# Patient Record
Sex: Female | Born: 1960 | Race: Black or African American | Hispanic: No | Smoking: Former smoker
Health system: Southern US, Community
[De-identification: ages and names within clinical notes are randomized; demographics above are authoritative.]

## PROBLEM LIST (undated history)

## (undated) DIAGNOSIS — K642 Third degree hemorrhoids: Secondary | ICD-10-CM

## (undated) DIAGNOSIS — A630 Anogenital (venereal) warts: Secondary | ICD-10-CM

## (undated) DIAGNOSIS — K5909 Other constipation: Secondary | ICD-10-CM

## (undated) DIAGNOSIS — I10 Essential (primary) hypertension: Secondary | ICD-10-CM

---

## 2006-02-27 ENCOUNTER — Encounter: Admission: RE | Admit: 2006-02-27 | Discharge: 2006-02-27 | Payer: Self-pay | Admitting: Gastroenterology

## 2008-02-07 ENCOUNTER — Emergency Department (HOSPITAL_COMMUNITY): Admission: EM | Admit: 2008-02-07 | Discharge: 2008-02-08 | Payer: Self-pay | Admitting: Emergency Medicine

## 2009-04-11 IMAGING — CR DG CHEST 2V
2 series · 2 of 2 positions shown · non-contrast
Comparison: None

CLINICAL DATA: Chest pain, shortness of breath

CHEST - 2 VIEW

[w chest pa]
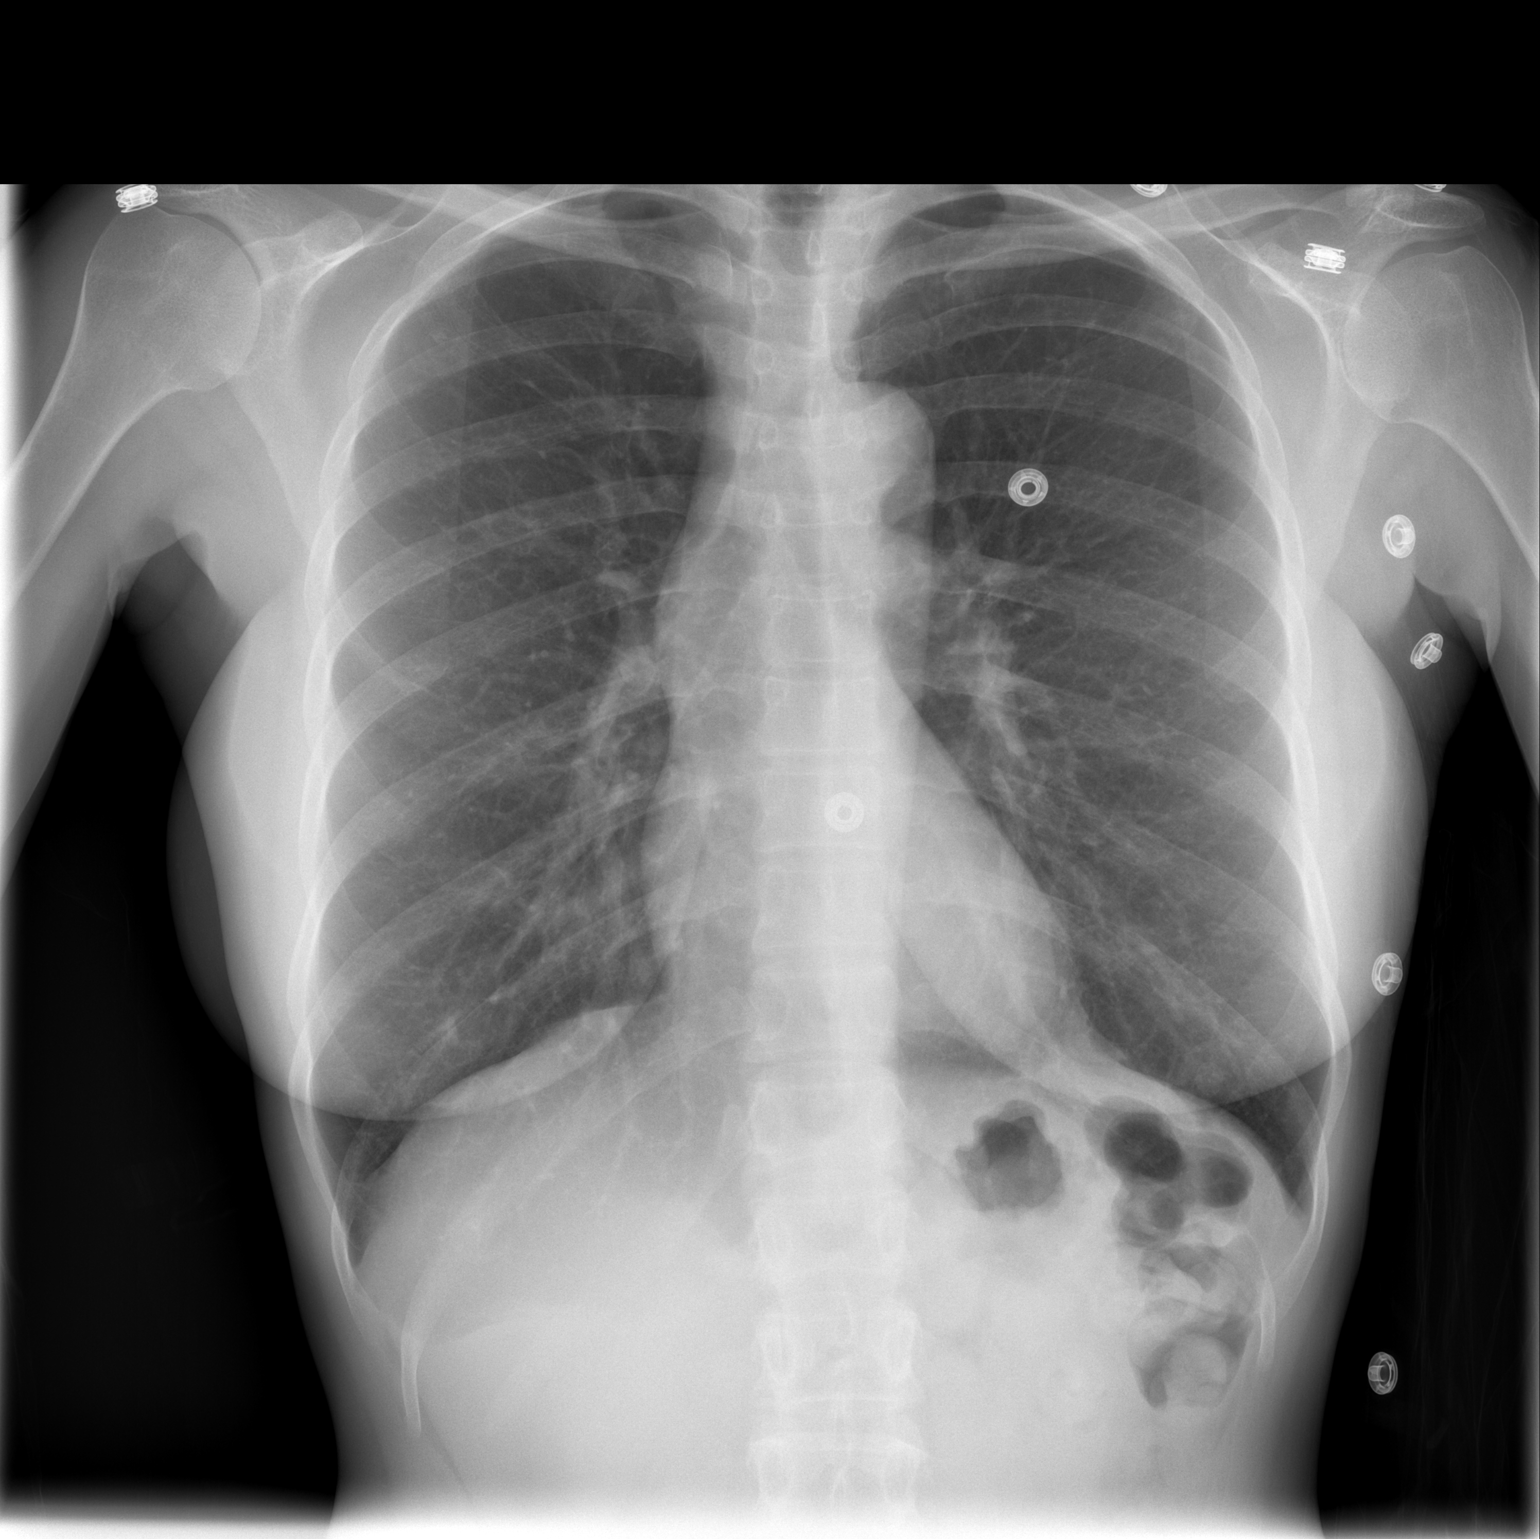

[w chest lat]
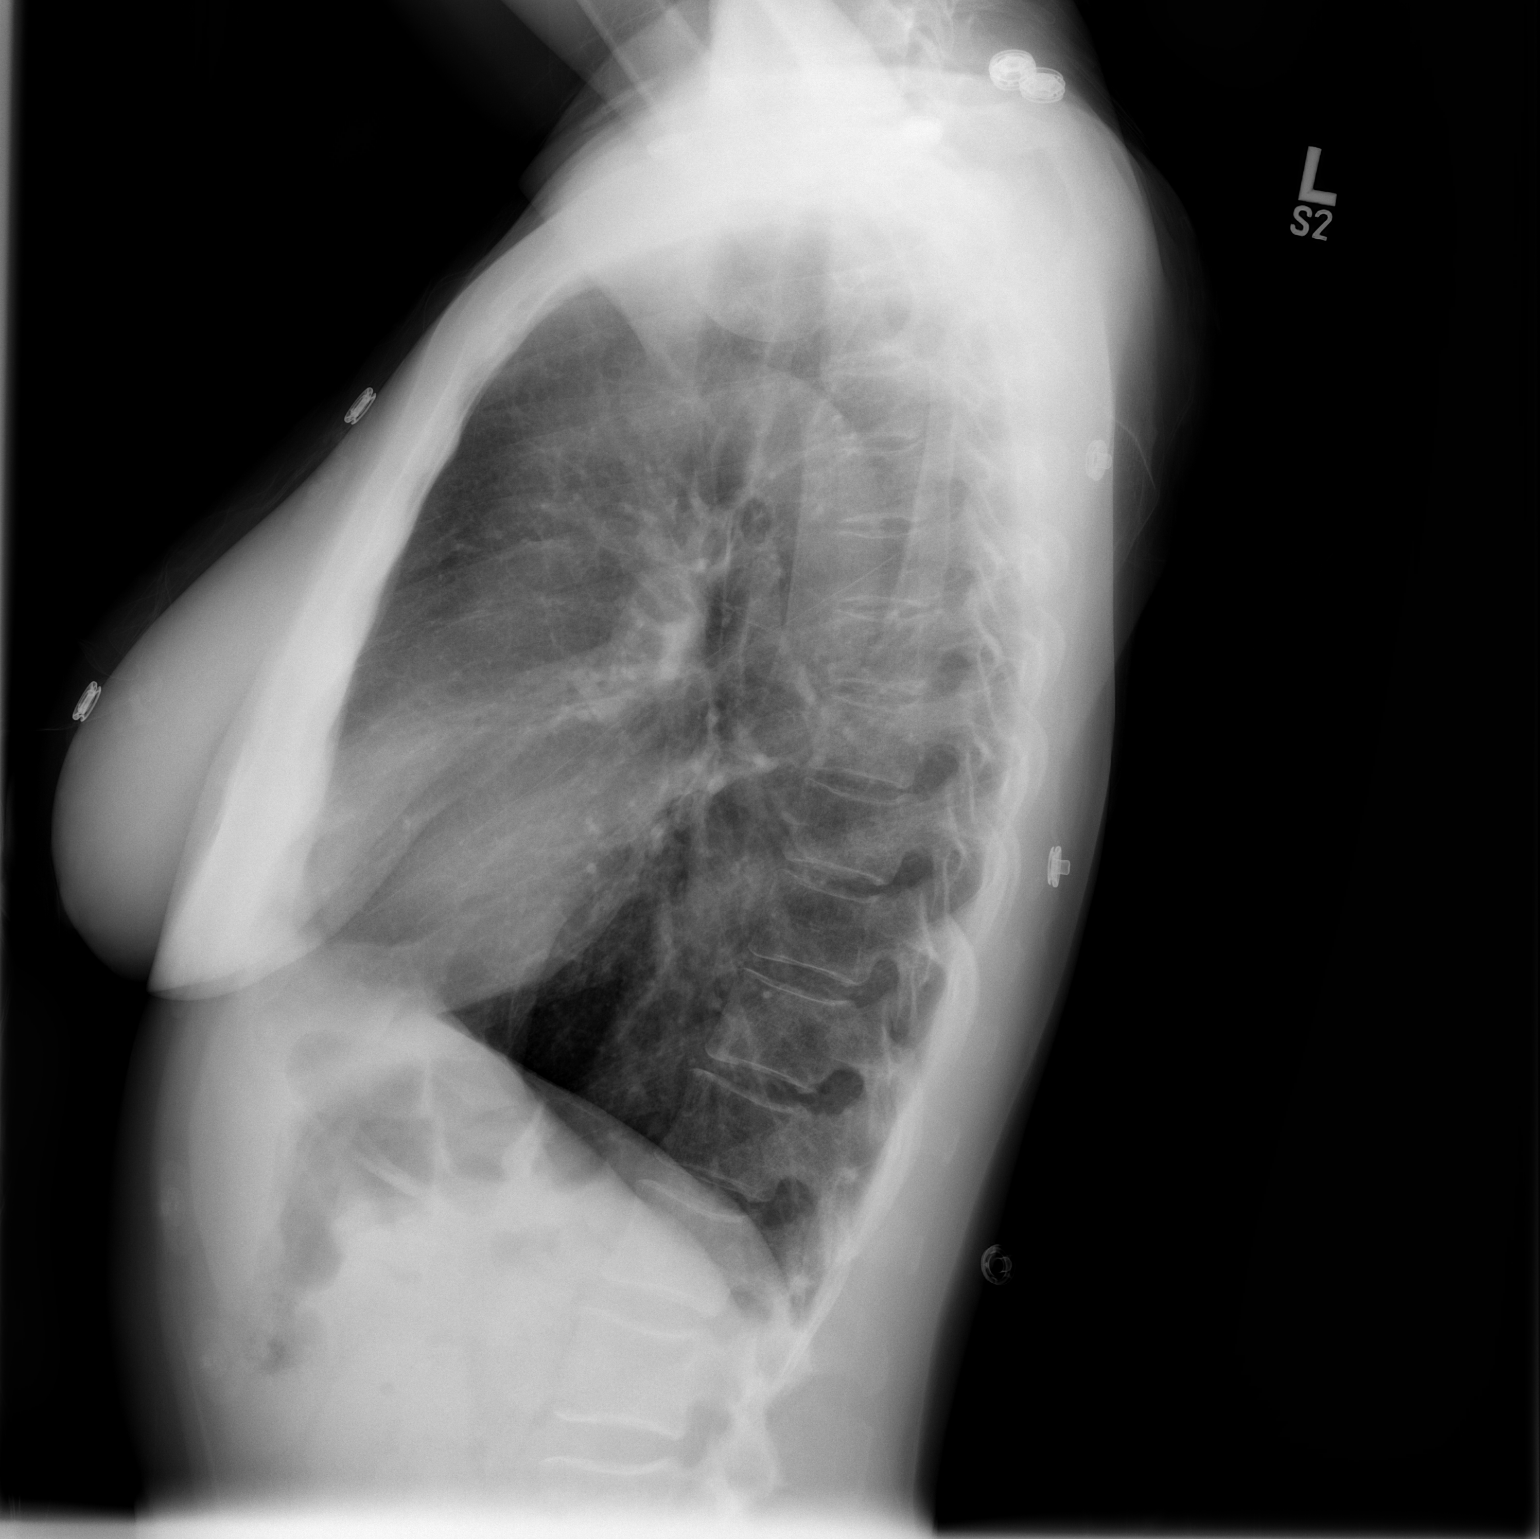

[2 of 2 positions shown; findings below may reference images not displayed]

FINDINGS: Heart size is normal.  Lungs are clear.  No pleural
effusion.  No acute osseous finding.
IMPRESSION: No acute cardiopulmonary process.

## 2011-01-17 LAB — POCT I-STAT, CHEM 8
BUN: 12
Calcium, Ion: 1.17
Chloride: 107
Creatinine, Ser: 1.1
Glucose, Bld: 91

## 2011-01-17 LAB — DIFFERENTIAL
Basophils Absolute: 0.1
Basophils Relative: 1
Eosinophils Absolute: 0.5
Lymphocytes Relative: 40
Monocytes Relative: 12

## 2011-01-17 LAB — POCT CARDIAC MARKERS
CKMB, poc: <1 — ABNORMAL LOW
CKMB, poc: <1 — ABNORMAL LOW
Myoglobin, poc: 90.1
Troponin i, poc: <0.05
Troponin i, poc: <0.05

## 2011-01-17 LAB — CBC
HCT: 36.5
Hemoglobin: 12.3
Platelets: 228
RDW: 13.1
WBC: 9.1

## 2011-04-18 HISTORY — PX: COLONOSCOPY: SHX174

## 2019-07-19 ENCOUNTER — Ambulatory Visit: Payer: Self-pay | Attending: Internal Medicine

## 2019-07-19 DIAGNOSIS — Z23 Encounter for immunization: Secondary | ICD-10-CM

## 2019-07-19 NOTE — Progress Notes (Signed)
   Covid-19 Vaccination Clinic  Name:  Christy Sweeney    MRN: [id number] DOB: Jun 17, 1960  07/19/2019  Ms. Christy Sweeney was observed post Covid-19 immunization for 15 minutes without incident. She was provided with Vaccine Information Sheet and instruction to access the V-Safe system.   Ms. Christy Sweeney was instructed to call 911 with any severe reactions post vaccine: Marland Kitchen Difficulty breathing  . Swelling of face and throat  . A fast heartbeat  . A bad rash all over body  . Dizziness and weakness   Immunizations Administered    Name Date Dose VIS Date Route   Pfizer COVID-19 Vaccine 07/19/2019 12:07 PM 0.3 mL 03/28/2019 Intramuscular   Manufacturer: [employer], [employer]   Lot: [id number]   NDC: [address]-6

## 2019-08-12 ENCOUNTER — Ambulatory Visit: Payer: Self-pay | Attending: Internal Medicine

## 2019-08-12 DIAGNOSIS — Z23 Encounter for immunization: Secondary | ICD-10-CM

## 2019-08-12 NOTE — Progress Notes (Signed)
   Covid-19 Vaccination Clinic  Name:  Christy Sweeney    MRN: [id number] DOB: [birth year]  08/12/2019  [name] was observed post Covid-19 immunization for 15 minutes without incident. She was provided with Vaccine Information Sheet and instruction to access the V-Safe system.   [name] was instructed to call 911 with any severe reactions post vaccine: Marland Kitchen Difficulty breathing  . Swelling of face and throat  . A fast heartbeat  . A bad rash all over body  . Dizziness and weakness   Immunizations Administered    Name Date Dose VIS Date Route   Pfizer COVID-19 Vaccine 08/12/2019 10:29 AM 0.3 mL 06/11/2018 Intramuscular   Manufacturer: [employer], [employer]   Lot: [id number]   NDC: [address]-1

## 2021-08-30 ENCOUNTER — Ambulatory Visit: Payer: Self-pay | Admitting: General Surgery

## 2021-08-30 NOTE — H&P (Signed)
?REFERRING PHYSICIAN:  Boone Master, PA ? ?PROVIDER:  Elenora Gamma, MD ? ?MRN: [id number] ?DOB: July 21, 1960 ?DATE OF ENCOUNTER: 08/30/2021 ? ?Subjective  ? ?Chief Complaint: Rectal Pain ?  ? ? ?History of Present Illness: ?Christy Sweeney is a 61 y.o. female who is seen today as an office consultation at the request of Dr. Darreld Mclean for evaluation of Rectal Pain ?.   ? ?61 year old female who presents to the office with worsening anal pain.  This is particularly painful with wiping and episodes of constipation.  She takes Metamucil Gummies for her constipation.  She denies any rectal bleeding. ? ? ?Review of Systems: ?A complete review of systems was obtained from the patient.  I have reviewed this information and discussed as appropriate with the patient.  See HPI as well for other ROS. ? ? ? ?Medical History: ?Past Medical History:  ?Diagnosis Date  ? Hypertension   ? ? ?There is no problem list on file for this patient. ? ? ?History reviewed. No pertinent surgical history.  ? ?No Known Allergies ? ?Current Outpatient Medications on File Prior to Visit  ?Medication Sig Dispense Refill  ? amLODIPine (NORVASC) 5 MG tablet TAKE 1 TABLET BY MOUTH EVERY DAY ORALLY ONCE DAILY 90 DAYS    ? enalapriL-hydroCHLOROthiazide (VASERETIC) 10-25 mg tablet Take 1 tablet by mouth once daily    ? ?No current facility-administered medications on file prior to visit.  ? ? ?Family History  ?Problem Relation Age of Onset  ? High blood pressure (Hypertension) Mother   ? High blood pressure (Hypertension) Father   ? High blood pressure (Hypertension) Sister   ? High blood pressure (Hypertension) Other   ?  ? ?Social History  ? ?Tobacco Use  ?Smoking Status Never  ?Smokeless Tobacco Never  ?  ? ?Social History  ? ?Socioeconomic History  ? Marital status: Unknown  ?Tobacco Use  ? Smoking status: Never  ? Smokeless tobacco: Never  ?Vaping Use  ? Vaping Use: Never used  ?Substance and Sexual Activity  ? Alcohol use: Yes  ? Drug use:  Never  ? ? ?Objective:  ? ? ?Vitals:  ? 08/30/21 1115  ?BP: 122/68  ?Pulse: 92  ?Temp: 36.6 ?C (97.8 ?F)  ?SpO2: 96%  ?Weight: 60.1 kg (132 lb 9.6 oz)  ?Height: 165.1 cm (5\' 5" )  ?  ? ?Exam ?Gen: NAD ?Abd: soft ?CV: RRR ?Lungs: CTA ?Rectal: Anterior midline external condyloma approximately 15 mm x 10 mm ? ? ?Labs, Imaging and Diagnostic Testing: ? ?Procedure: Anoscopy ?Surgeon: ?After the risks and benefits were explained, written consent was obtained for above procedure.  A medical assistant chaperone was present thoroughout the entire procedure.  ?Anesthesia: none ?Diagnosis: Anal pain ?Findings: Grade 3 left lateral hemorrhoid, grade 1 right anterior right posterior hemorrhoid ? ?Assessment and Plan:  ?Diagnoses and all orders for this visit: ? 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  I recommended excision with biopsy. ?We discussed the management of anal warts.  I explained how the surgery is performed. I explained that it can be painful however it generally has the highest success rate. We discussed the risk and benefits of surgery including but not limited to bleeding, infection, injury to surrounding structures, need to do a formal anoscopic exam to evaluate for anal canal warts, urinary retention, wart recurrence, and general anesthesia risk. We discussed the typical aftercare. ?Prolapsed internal hemorrhoids, grade 3 ? ?Patient with a prolapsed grade 3 hemorrhoid.  It  does not look amenable to rubber band ligation.  I have recommended excision.  We discussed that this can cause significant pain postoperatively.  This will need to be managed with a multimodality approach.  Risk include bleeding, pain and recurrence.  I strongly emphasized the need to keep her bowel movements soft during the healing period to avoid prolonged postoperative pain. ? ?Vanita Panda, MD ?Colon and Rectal Surgery ?[facility]  ? ?

## 2021-10-04 ENCOUNTER — Encounter (HOSPITAL_BASED_OUTPATIENT_CLINIC_OR_DEPARTMENT_OTHER): Payer: Self-pay | Admitting: General Surgery

## 2021-10-04 NOTE — Progress Notes (Signed)
Spoke w/ via phone for pre-op interview--- pt Lab needs dos----   [employer] , ekg            Lab results------ no COVID test -----patient states asymptomatic no test needed Arrive at ------- 0715 on 10-07-2021 NPO after MN NO Solid Food.  Clear liquids from MN until--- 0615 Med rec completed Medications to take morning of surgery ----- norvasc Diabetic medication ----- n/a Patient instructed no nail polish to be worn day of surgery Patient instructed to bring photo id and insurance card day of surgery Patient aware to have Driver (ride ) / caregiver for 24 hours after surgery -- sig other, charles Patient Special Instructions ----- n/a Pre-Op special Istructions ----- n/a Patient verbalized understanding of instructions that were given at this phone interview. Patient denies shortness of breath, chest pain, fever, cough at this phone interview.

## 2021-10-07 ENCOUNTER — Other Ambulatory Visit: Payer: Self-pay

## 2021-10-07 ENCOUNTER — Ambulatory Visit (HOSPITAL_BASED_OUTPATIENT_CLINIC_OR_DEPARTMENT_OTHER): Payer: Self-pay | Admitting: Anesthesiology

## 2021-10-07 ENCOUNTER — Encounter (HOSPITAL_BASED_OUTPATIENT_CLINIC_OR_DEPARTMENT_OTHER)
Admission: RE | Disposition: A | Discharge status: Home / Self Care | Payer: Self-pay | Source: Home / Self Care | Attending: General Surgery

## 2021-10-07 ENCOUNTER — Ambulatory Visit (HOSPITAL_BASED_OUTPATIENT_CLINIC_OR_DEPARTMENT_OTHER)
Admission: RE | Admit: 2021-10-07 | Discharge: 2021-10-07 | Disposition: A | Discharge status: Home / Self Care | Payer: Self-pay | Attending: General Surgery | Admitting: General Surgery

## 2021-10-07 ENCOUNTER — Encounter (HOSPITAL_BASED_OUTPATIENT_CLINIC_OR_DEPARTMENT_OTHER): Payer: Self-pay | Admitting: General Surgery

## 2021-10-07 DIAGNOSIS — I119 Hypertensive heart disease without heart failure: Secondary | ICD-10-CM | POA: Insufficient documentation

## 2021-10-07 DIAGNOSIS — A630 Anogenital (venereal) warts: Secondary | ICD-10-CM

## 2021-10-07 DIAGNOSIS — Z87891 Personal history of nicotine dependence: Secondary | ICD-10-CM

## 2021-10-07 DIAGNOSIS — K642 Third degree hemorrhoids: Secondary | ICD-10-CM | POA: Insufficient documentation

## 2021-10-07 DIAGNOSIS — Z01818 Encounter for other preprocedural examination: Secondary | ICD-10-CM

## 2021-10-07 DIAGNOSIS — I10 Essential (primary) hypertension: Secondary | ICD-10-CM

## 2021-10-07 HISTORY — DX: Essential (primary) hypertension: I10

## 2021-10-07 HISTORY — DX: Third degree hemorrhoids: K64.2

## 2021-10-07 HISTORY — DX: Other constipation: K59.09

## 2021-10-07 HISTORY — PX: MASS EXCISION: SHX2000

## 2021-10-07 HISTORY — DX: Anogenital (venereal) warts: A63.0

## 2021-10-07 HISTORY — PX: HEMORRHOID SURGERY: SHX153

## 2021-10-07 LAB — POCT I-STAT, CHEM 8
BUN: 15 mg/dL (ref 8–23)
Calcium, Ion: 1.25 mmol/L (ref 1.15–1.40)
Chloride: 105 mmol/L (ref 98–111)
Creatinine, Ser: 0.8 mg/dL (ref 0.44–1.00)
Glucose, Bld: 100 mg/dL — ABNORMAL HIGH (ref 70–99)
HCT: 44 % (ref 36.0–46.0)
Hemoglobin: 15 g/dL (ref 12.0–15.0)
Potassium: 3.2 mmol/L — ABNORMAL LOW (ref 3.5–5.1)
Sodium: 140 mmol/L (ref 135–145)
TCO2: 21 mmol/L — ABNORMAL LOW (ref 22–32)

## 2021-10-07 SURGERY — HEMORRHOIDECTOMY
Anesthesia: Monitor Anesthesia Care

## 2021-10-07 MED ORDER — DEXMEDETOMIDINE (PRECEDEX) IN NS 20 MCG/5ML (4 MCG/ML) IV SYRINGE
PREFILLED_SYRINGE | INTRAVENOUS | Status: DC | PRN
  Administered 2021-10-07 (×4): 4 ug via INTRAVENOUS

## 2021-10-07 MED ORDER — BUPIVACAINE LIPOSOME 1.3 % IJ SUSP: 20.0000 mL | Freq: Once | INTRAMUSCULAR | Status: DC

## 2021-10-07 MED ORDER — PROPOFOL 1000 MG/100ML IV EMUL
INTRAVENOUS | Status: AC
  Filled 2021-10-07: qty 100

## 2021-10-07 MED ORDER — LACTATED RINGERS IV SOLN: INTRAVENOUS | Status: DC

## 2021-10-07 MED ORDER — PHENYLEPHRINE 80 MCG/ML (10ML) SYRINGE FOR IV PUSH (FOR BLOOD PRESSURE SUPPORT)
PREFILLED_SYRINGE | INTRAVENOUS | Status: AC
  Filled 2021-10-07: qty 10

## 2021-10-07 MED ORDER — LIDOCAINE HCL (CARDIAC) PF 100 MG/5ML IV SOSY
PREFILLED_SYRINGE | INTRAVENOUS | Status: DC | PRN
  Administered 2021-10-07: 50 mg via INTRAVENOUS

## 2021-10-07 MED ORDER — MIDAZOLAM HCL 5 MG/5ML IJ SOLN
INTRAMUSCULAR | Status: DC | PRN
  Administered 2021-10-07: 2 mg via INTRAVENOUS

## 2021-10-07 MED ORDER — BUPIVACAINE LIPOSOME 1.3 % IJ SUSP
INTRAMUSCULAR | Status: DC | PRN
  Administered 2021-10-07: 20 mL

## 2021-10-07 MED ORDER — CELECOXIB 200 MG PO CAPS
ORAL_CAPSULE | ORAL | Status: AC
  Filled 2021-10-07: qty 1

## 2021-10-07 MED ORDER — ACETAMINOPHEN 500 MG PO TABS
ORAL_TABLET | ORAL | Status: AC
  Filled 2021-10-07: qty 2

## 2021-10-07 MED ORDER — PROPOFOL 10 MG/ML IV BOLUS
INTRAVENOUS | Status: AC
  Filled 2021-10-07: qty 20

## 2021-10-07 MED ORDER — PHENYLEPHRINE 80 MCG/ML (10ML) SYRINGE FOR IV PUSH (FOR BLOOD PRESSURE SUPPORT)
PREFILLED_SYRINGE | INTRAVENOUS | Status: DC | PRN
  Administered 2021-10-07: 80 ug via INTRAVENOUS

## 2021-10-07 MED ORDER — ARTIFICIAL TEARS OPHTHALMIC OINT
TOPICAL_OINTMENT | OPHTHALMIC | Status: AC
  Filled 2021-10-07: qty 3.5

## 2021-10-07 MED ORDER — PROPOFOL 500 MG/50ML IV EMUL
INTRAVENOUS | Status: DC | PRN
  Administered 2021-10-07: 200 ug/kg/min via INTRAVENOUS

## 2021-10-07 MED ORDER — ONDANSETRON HCL 4 MG/2ML IJ SOLN
INTRAMUSCULAR | Status: DC | PRN
  Administered 2021-10-07: 4 mg via INTRAVENOUS

## 2021-10-07 MED ORDER — GABAPENTIN 300 MG PO CAPS
300.0000 mg | ORAL_CAPSULE | ORAL | Status: AC
  Administered 2021-10-07: 300 mg via ORAL

## 2021-10-07 MED ORDER — 0.9 % SODIUM CHLORIDE (POUR BTL) OPTIME
TOPICAL | Status: DC | PRN
  Administered 2021-10-07: 500 mL

## 2021-10-07 MED ORDER — GABAPENTIN 300 MG PO CAPS
ORAL_CAPSULE | ORAL | Status: AC
  Filled 2021-10-07: qty 1

## 2021-10-07 MED ORDER — BUPIVACAINE-EPINEPHRINE 0.5% -1:200000 IJ SOLN
INTRAMUSCULAR | Status: DC | PRN
  Administered 2021-10-07: 30 mL

## 2021-10-07 MED ORDER — OXYCODONE HCL 5 MG PO TABS: 5.0000 mg | ORAL_TABLET | Freq: Four times a day (QID) | ORAL | 0 refills | Status: AC | PRN

## 2021-10-07 MED ORDER — MIDAZOLAM HCL 2 MG/2ML IJ SOLN
INTRAMUSCULAR | Status: AC
  Filled 2021-10-07: qty 2

## 2021-10-07 MED ORDER — SODIUM CHLORIDE 0.9% FLUSH: 3.0000 mL | Freq: Two times a day (BID) | INTRAVENOUS | Status: DC

## 2021-10-07 MED ORDER — PROPOFOL 10 MG/ML IV BOLUS
INTRAVENOUS | Status: DC | PRN
  Administered 2021-10-07 (×2): 20 mg via INTRAVENOUS

## 2021-10-07 MED ORDER — CELECOXIB 200 MG PO CAPS
200.0000 mg | ORAL_CAPSULE | ORAL | Status: AC
  Administered 2021-10-07: 200 mg via ORAL

## 2021-10-07 MED ORDER — ONDANSETRON HCL 4 MG/2ML IJ SOLN
INTRAMUSCULAR | Status: AC
  Filled 2021-10-07: qty 2

## 2021-10-07 MED ORDER — ACETAMINOPHEN 500 MG PO TABS
1000.0000 mg | ORAL_TABLET | ORAL | Status: AC
  Administered 2021-10-07: 1000 mg via ORAL

## 2021-10-07 SURGICAL SUPPLY — 57 items
ADH SKN CLS APL DERMABOND .7 (GAUZE/BANDAGES/DRESSINGS)
APL PRP STRL LF DISP 70% ISPRP (MISCELLANEOUS) ×1
APL SKNCLS STERI-STRIP NONHPOA (GAUZE/BANDAGES/DRESSINGS)
BENZOIN TINCTURE PRP APPL 2/3 (GAUZE/BANDAGES/DRESSINGS) IMPLANT
BLADE CLIPPER SENSICLIP SURGIC (BLADE) IMPLANT
BLADE EXTENDED COATED 6.5IN (ELECTRODE) IMPLANT
BLADE SURG 10 STRL SS (BLADE) ×3 IMPLANT
CHLORAPREP W/TINT 26 (MISCELLANEOUS) ×3 IMPLANT
COVER BACK TABLE 60X90IN (DRAPES) ×3 IMPLANT
COVER MAYO STAND STRL (DRAPES) ×3 IMPLANT
DECANTER SPIKE VIAL GLASS SM (MISCELLANEOUS) IMPLANT
DERMABOND ADVANCED (GAUZE/BANDAGES/DRESSINGS)
DERMABOND ADVANCED .7 DNX12 (GAUZE/BANDAGES/DRESSINGS) IMPLANT
DRAPE LAPAROTOMY 100X72 PEDS (DRAPES) ×3 IMPLANT
DRAPE UTILITY XL STRL (DRAPES) ×3 IMPLANT
DRSG PAD ABDOMINAL 8X10 ST (GAUZE/BANDAGES/DRESSINGS) ×3 IMPLANT
DRSG TEGADERM 4X4.75 (GAUZE/BANDAGES/DRESSINGS) IMPLANT
ELECT REM PT RETURN 9FT ADLT (ELECTROSURGICAL) ×2
ELECTRODE REM PT RTRN 9FT ADLT (ELECTROSURGICAL) ×2 IMPLANT
GAUZE 4X4 16PLY ~~LOC~~+RFID DBL (SPONGE) ×3 IMPLANT
GAUZE SPONGE 4X4 12PLY STRL (GAUZE/BANDAGES/DRESSINGS) ×3 IMPLANT
GAUZE SPONGE 4X4 8PLY NS (GAUZE/BANDAGES/DRESSINGS) ×1 IMPLANT
GLOVE BIO SURGEON STRL SZ 6.5 (GLOVE) ×6 IMPLANT
GLOVE BIOGEL PI IND STRL 7.0 (GLOVE) ×2 IMPLANT
GLOVE BIOGEL PI INDICATOR 7.0 (GLOVE) ×1
GLOVE INDICATOR 6.5 STRL GRN (GLOVE) ×3 IMPLANT
KIT SIGMOIDOSCOPE (SET/KITS/TRAYS/PACK) IMPLANT
KIT TURNOVER CYSTO (KITS) ×3 IMPLANT
NEEDLE HYPO 22GX1.5 SAFETY (NEEDLE) ×3 IMPLANT
NS IRRIG 500ML POUR BTL (IV SOLUTION) ×3 IMPLANT
PACK BASIN DAY SURGERY FS (CUSTOM PROCEDURE TRAY) ×3 IMPLANT
PAD ARMBOARD 7.5X6 YLW CONV (MISCELLANEOUS) IMPLANT
PANTS MESH DISP LRG (UNDERPADS AND DIAPERS) IMPLANT
PENCIL SMOKE EVACUATOR (MISCELLANEOUS) ×3 IMPLANT
SPONGE HEMORRHOID 8X3CM (HEMOSTASIS) IMPLANT
SPONGE SURGIFOAM ABS GEL 100 (HEMOSTASIS) IMPLANT
SPONGE SURGIFOAM ABS GEL 12-7 (HEMOSTASIS) IMPLANT
STRIP CLOSURE SKIN 1/2X4 (GAUZE/BANDAGES/DRESSINGS) IMPLANT
SUT CHROMIC 2 0 SH (SUTURE) ×7 IMPLANT
SUT CHROMIC 3 0 SH 27 (SUTURE) ×7 IMPLANT
SUT ETHILON 2 0 FS 18 (SUTURE) IMPLANT
SUT ETHILON 4 0 PS 2 18 (SUTURE) IMPLANT
SUT SILK 2 0 SH (SUTURE) IMPLANT
SUT VIC AB 2-0 SH 27 (SUTURE)
SUT VIC AB 2-0 SH 27XBRD (SUTURE) IMPLANT
SUT VIC AB 3-0 SH 18 (SUTURE) IMPLANT
SUT VIC AB 4-0 PS2 18 (SUTURE) IMPLANT
SUT VIC AB 4-0 SH 18 (SUTURE) IMPLANT
SWAB CULTURE ESWAB REG 1ML (MISCELLANEOUS) IMPLANT
SYR BULB IRRIG 60ML STRL (SYRINGE) ×3 IMPLANT
SYR CONTROL 10ML LL (SYRINGE) ×3 IMPLANT
TOWEL OR 17X26 10 PK STRL BLUE (TOWEL DISPOSABLE) ×3 IMPLANT
TRAY DSU PREP LF (CUSTOM PROCEDURE TRAY) ×3 IMPLANT
TUBE CONNECTING 12X1/4 (SUCTIONS) ×3 IMPLANT
UNDERPAD 30X36 HEAVY ABSORB (UNDERPADS AND DIAPERS) IMPLANT
WATER STERILE IRR 500ML POUR (IV SOLUTION) ×3 IMPLANT
YANKAUER SUCT BULB TIP NO VENT (SUCTIONS) ×3 IMPLANT

## 2021-10-10 ENCOUNTER — Encounter (HOSPITAL_BASED_OUTPATIENT_CLINIC_OR_DEPARTMENT_OTHER): Payer: Self-pay | Admitting: General Surgery

## 2021-10-11 LAB — SURGICAL PATHOLOGY
